# Patient Record
Sex: Female | Born: 2000 | Race: Black or African American | Hispanic: No | Marital: Single | Smoking: Never smoker
Health system: Southern US, Community
[De-identification: ages and names within clinical notes are randomized; demographics above are authoritative.]

---

## 2020-04-18 ENCOUNTER — Other Ambulatory Visit: Payer: Self-pay

## 2020-04-18 ENCOUNTER — Emergency Department (HOSPITAL_COMMUNITY): Payer: 59

## 2020-04-18 ENCOUNTER — Emergency Department (HOSPITAL_COMMUNITY)
Admission: EM | Admit: 2020-04-18 | Discharge: 2020-04-18 | Disposition: A | Discharge status: Home / Self Care | Payer: 59 | Attending: Emergency Medicine | Admitting: Emergency Medicine

## 2020-04-18 ENCOUNTER — Encounter (HOSPITAL_COMMUNITY): Payer: Self-pay | Admitting: Emergency Medicine

## 2020-04-18 DIAGNOSIS — J069 Acute upper respiratory infection, unspecified: Secondary | ICD-10-CM | POA: Insufficient documentation

## 2020-04-18 DIAGNOSIS — Z20822 Contact with and (suspected) exposure to covid-19: Secondary | ICD-10-CM | POA: Insufficient documentation

## 2020-04-18 DIAGNOSIS — R059 Cough, unspecified: Secondary | ICD-10-CM | POA: Diagnosis present

## 2020-04-18 LAB — CBC
HCT: 37.1 % (ref 36.0–46.0)
Hemoglobin: 12.2 g/dL (ref 12.0–15.0)
MCH: 28.9 pg (ref 26.0–34.0)
MCHC: 32.9 g/dL (ref 30.0–36.0)
MCV: 87.9 fL (ref 80.0–100.0)
Platelets: 354 10*3/uL (ref 150–400)
RBC: 4.22 MIL/uL (ref 3.87–5.11)
RDW: 12.8 % (ref 11.5–15.5)
WBC: 16.2 10*3/uL — ABNORMAL HIGH (ref 4.0–10.5)
nRBC: 0 % (ref 0.0–0.2)

## 2020-04-18 LAB — COMPREHENSIVE METABOLIC PANEL
ALT: 14 U/L (ref 0–44)
AST: 16 U/L (ref 15–41)
Albumin: 3.8 g/dL (ref 3.5–5.0)
Alkaline Phosphatase: 54 U/L (ref 38–126)
Anion gap: 9 (ref 5–15)
BUN: 7 mg/dL (ref 6–20)
CO2: 25 mmol/L (ref 22–32)
Calcium: 8.9 mg/dL (ref 8.9–10.3)
Chloride: 102 mmol/L (ref 98–111)
Creatinine, Ser: 0.66 mg/dL (ref 0.44–1.00)
GFR calc Af Amer: >60 mL/min (ref >60)
GFR calc non Af Amer: >60 mL/min (ref >60)
Glucose, Bld: 124 mg/dL — ABNORMAL HIGH (ref 70–99)
Potassium: 3.5 mmol/L (ref 3.5–5.1)
Sodium: 136 mmol/L (ref 135–145)
Total Bilirubin: 0.2 mg/dL — ABNORMAL LOW (ref 0.3–1.2)
Total Protein: 7.6 g/dL (ref 6.5–8.1)

## 2020-04-18 LAB — URINALYSIS, ROUTINE W REFLEX MICROSCOPIC
Bilirubin Urine: NEGATIVE
Glucose, UA: NEGATIVE mg/dL
Ketones, ur: NEGATIVE mg/dL
Nitrite: NEGATIVE
Protein, ur: NEGATIVE mg/dL
Specific Gravity, Urine: 1.021 (ref 1.005–1.030)
pH: 6 (ref 5.0–8.0)

## 2020-04-18 LAB — RESP PANEL BY RT PCR (RSV, FLU A&B, COVID)
Influenza A by PCR: NEGATIVE
Influenza B by PCR: NEGATIVE
Respiratory Syncytial Virus by PCR: NEGATIVE
SARS Coronavirus 2 by RT PCR: NEGATIVE

## 2020-04-18 LAB — I-STAT BETA HCG BLOOD, ED (MC, WL, AP ONLY): I-stat hCG, quantitative: <5 m[IU]/mL (ref <5)

## 2020-04-18 LAB — LIPASE, BLOOD: Lipase: 29 U/L (ref 11–51)

## 2020-04-18 MED ORDER — GUAIFENESIN 100 MG/5ML PO SOLN
5.0000 mL | Freq: Once | ORAL | Status: AC
  Administered 2020-04-18: 100 mg via ORAL
  Filled 2020-04-18: qty 5

## 2020-04-18 MED ORDER — BENZONATATE 100 MG PO CAPS: 100.0000 mg | ORAL_CAPSULE | Freq: Three times a day (TID) | ORAL | 0 refills | Status: AC

## 2020-04-18 MED ORDER — ONDANSETRON 4 MG PO TBDP
4.0000 mg | ORAL_TABLET | Freq: Once | ORAL | Status: AC | PRN
  Administered 2020-04-18: 4 mg via ORAL
  Filled 2020-04-18: qty 1

## 2020-04-18 NOTE — ED Provider Notes (Signed)
[facility]ENCY DEPT [facility] Emergency Department Provider Note MRN:  [id number]  Arrival date & time: 04/18/20     Chief Complaint   Cough   History of Present Illness   Kelsey Cardenas is a 19 y.o. year-old female with no pertinent past medical history presenting to the ED with chief complaint of cough.  1 week of persistent cough, multiple friends with similar cough.  Denies shortness of breath, no chest pain, mild sore throat, no abdominal pain, no fever, no other complaints.  Review of Systems  A problem-focused ROS was performed. Positive for cough.  Patient denies shortness of breath..   Patient's Health History   History reviewed. No pertinent past medical history.  History reviewed. No pertinent surgical history.  No family history on file.  Social History   Socioeconomic History  . Marital status: Single    Spouse name: Not on file  . Number of children: Not on file  . Years of education: Not on file  . Highest education level: Not on file  Occupational History  . Not on file  Tobacco Use  . Smoking status: Never Smoker  . Smokeless tobacco: Never Used  Substance and Sexual Activity  . Alcohol use: Never  . Drug use: Never  . Sexual activity: Not on file  Other Topics Concern  . Not on file  Social History Narrative  . Not on file   Social Determinants of Health   Financial Resource Strain:   . Difficulty of Paying Living Expenses: Not on file  Food Insecurity:   . Worried About Programme researcher, broadcasting/film/video in the Last Year: Not on file  . Ran Out of Food in the Last Year: Not on file  Transportation Needs:   . Lack of Transportation (Medical): Not on file  . Lack of Transportation (Non-Medical): Not on file  Physical Activity:   . Days of Exercise per Week: Not on file  . Minutes of Exercise per Session: Not on file  Stress:   . Feeling of Stress : Not on file  Social Connections:   . Frequency of Communication with Friends and Family: Not on file    . Frequency of Social Gatherings with Friends and Family: Not on file  . Attends Religious Services: Not on file  . Active Member of Clubs or Organizations: Not on file  . Attends Banker Meetings: Not on file  . Marital Status: Not on file  Intimate Partner Violence:   . Fear of Current or Ex-Partner: Not on file  . Emotionally Abused: Not on file  . Physically Abused: Not on file  . Sexually Abused: Not on file     Physical Exam   Vitals:   04/18/20 0658 04/18/20 0913  BP: 136/79 (!) 95/58  Pulse: 77 81  Resp: 18 17  Temp: 98.4 F (36.9 C) 98.4 F (36.9 C)  SpO2: 99% 100%    CONSTITUTIONAL: Well-appearing, NAD NEURO:  Alert and oriented x 3, no focal deficits EYES:  eyes equal and reactive ENT/NECK:  no LAD, no JVD CARDIO: Regular rate, well-perfused, normal S1 and S2 PULM:  CTAB no wheezing or rhonchi GI/GU:  normal bowel sounds, non-distended, non-tender MSK/SPINE:  No gross deformities, no edema SKIN:  no rash, atraumatic PSYCH:  Appropriate speech and behavior  *Additional and/or pertinent findings included in MDM below  Diagnostic and Interventional Summary    EKG Interpretation  Date/Time:    Ventricular Rate:    PR Interval:    QRS  Duration:   QT Interval:    QTC Calculation:   R Axis:     Text Interpretation:        Labs Reviewed  COMPREHENSIVE METABOLIC PANEL - Abnormal; Notable for the following components:      Result Value   Glucose, Bld 124 (*)    Total Bilirubin 0.2 (*)    All other components within normal limits  CBC - Abnormal; Notable for the following components:   WBC 16.2 (*)    All other components within normal limits  URINALYSIS, ROUTINE W REFLEX MICROSCOPIC - Abnormal; Notable for the following components:   Hgb urine dipstick SMALL (*)    Leukocytes,Ua TRACE (*)    Bacteria, UA RARE (*)    All other components within normal limits  RESP PANEL BY RT PCR (RSV, FLU A&B, COVID)  LIPASE, BLOOD  I-STAT BETA HCG  BLOOD, ED ([facility], [facility], [facility] ONLY)    DG Chest Portable 1 View  Final Result      Medications  ondansetron (ZOFRAN-ODT) disintegrating tablet 4 mg (4 mg Oral Given 04/18/20 0336)  guaiFENesin (ROBITUSSIN) 100 MG/5ML solution 100 mg (100 mg Oral Given 04/18/20 0501)     Procedures  /  Critical Care Procedures  ED Course and Medical Decision Making  I have reviewed the triage vital signs, the nursing notes, and pertinent available records from the EMR.  Listed above are laboratory and imaging tests that I personally ordered, reviewed, and interpreted and then considered in my medical decision making (see below for details).  Suspect viral illness, has tested negative for Covid and negative for flu.  No chest pain or shortness of breath, lungs are generally clear on exam, no increased work of breathing, no leg pain or swelling, doubt PE, x-ray without evidence of pneumonia, appropriate for discharge.       Elmer Sow. Pilar Plate, MD [facility] Emergency Medicine [facility] [email or web address]  Final Clinical Impressions(s) / ED Diagnoses     ICD-10-CM   1. Viral URI with cough  J06.9     ED Discharge Orders         Ordered    benzonatate (TESSALON) 100 MG capsule  Every 8 hours        04/18/20 1028           Discharge Instructions Discussed with and Provided to Patient:     Discharge Instructions     You were evaluated in the Emergency Department and after careful evaluation, we did not find any emergent condition requiring admission or further testing in the hospital.  Your exam/testing today was overall reassuring.  Symptoms seem to be due to a viral illness.  You can use the Tessalon medication as needed to suppress your cough.  Please return to the Emergency Department if you experience any worsening of your condition.  Thank you for allowing [place] to be a part of your care.       Sabas Sous, MD 04/18/20 1030

## 2020-04-18 NOTE — ED Triage Notes (Signed)
Pt c/o continuous cough with n/v x 7 days, denies fever. Pt states she has weekly covid tests, neg result. Pt states her roommate and several friends have same cough.  Congested, continuous cough noted. No hx of asthma

## 2020-04-18 NOTE — Discharge Instructions (Addendum)
You were evaluated in the Emergency Department and after careful evaluation, we did not find any emergent condition requiring admission or further testing in the hospital.  Your exam/testing today was overall reassuring.  Symptoms seem to be due to a viral illness.  You can use the Tessalon medication as needed to suppress your cough.  Please return to the Emergency Department if you experience any worsening of your condition.  Thank you for allowing [place] to be a part of your care.

## 2020-04-18 NOTE — ED Notes (Signed)
Pt called for vitals x2 with no response 

## 2020-04-18 NOTE — ED Notes (Signed)
Patient verbalizes understanding of discharge instructions. Opportunity for questioning and answers were provided. Pt discharged from ED. 

## 2021-10-15 IMAGING — DX DG CHEST 1V PORT
1 series · 1 of 1 positions shown · non-contrast
Comparison: None.

CLINICAL DATA: Cough

EXAM:
PORTABLE CHEST 1 VIEW

[chest ap]
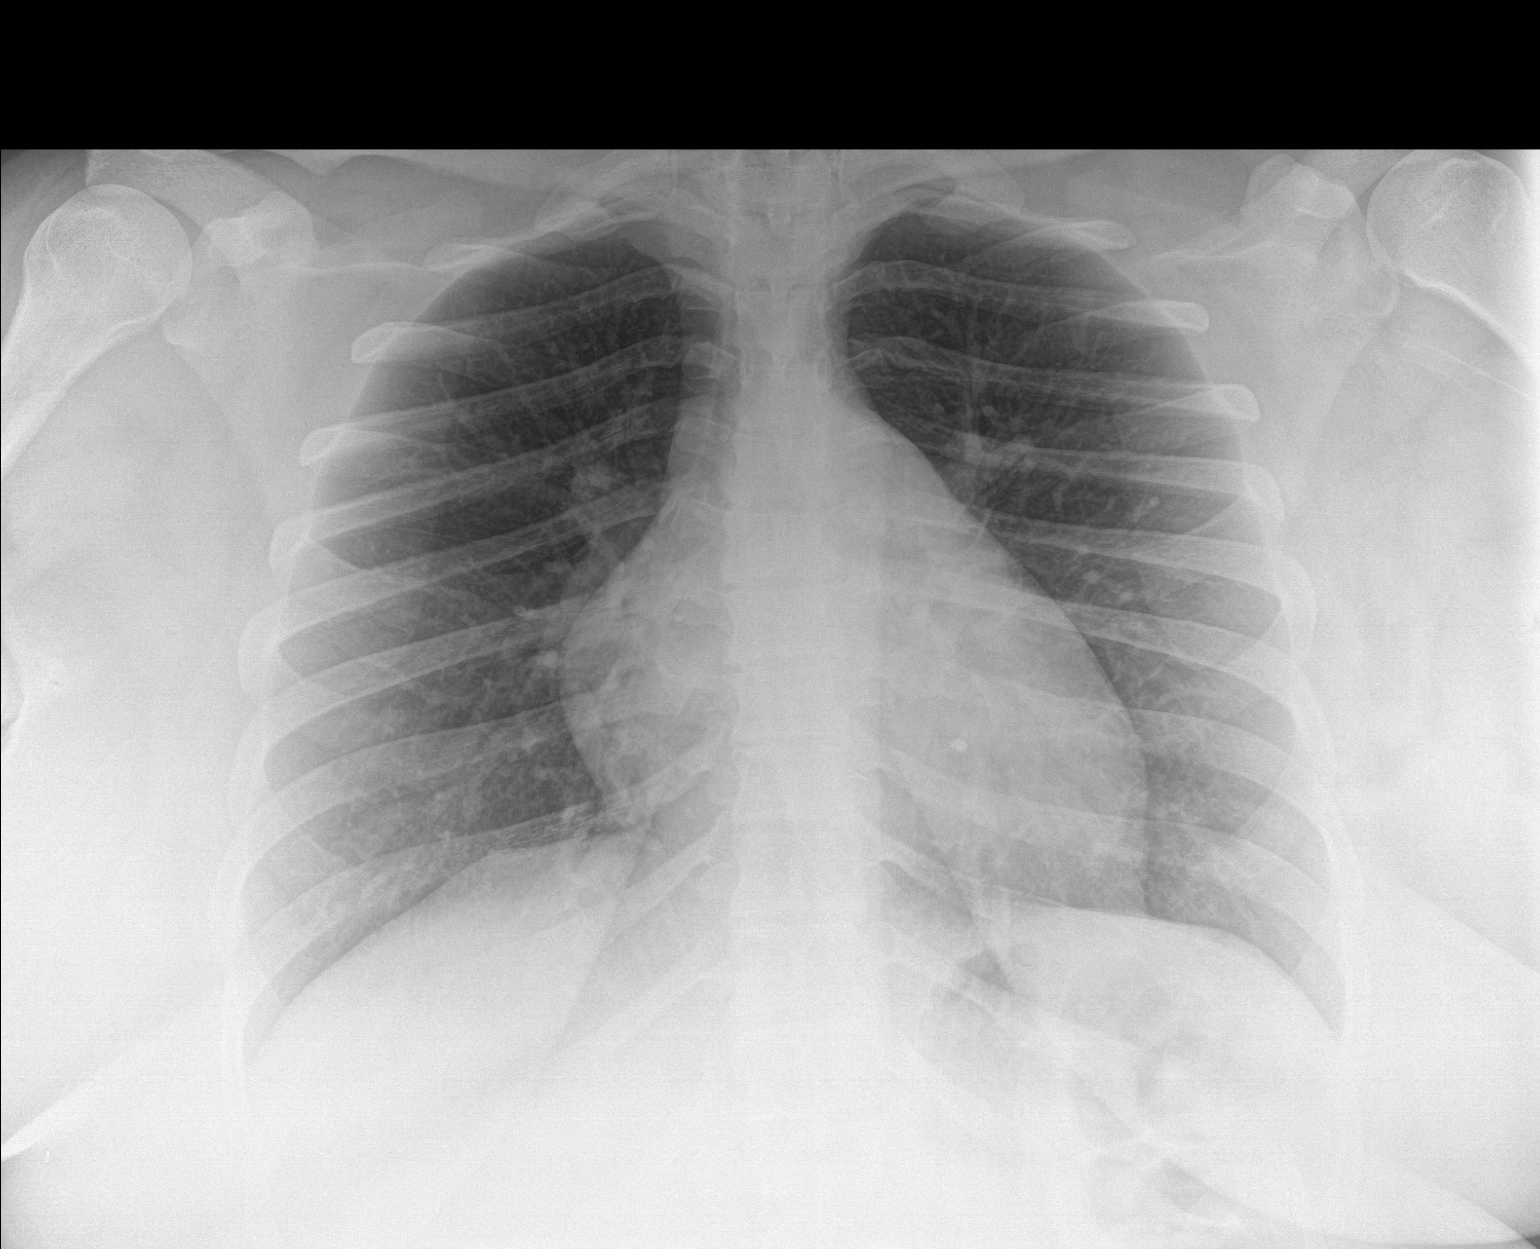

[1 of 1 positions shown; findings below may reference images not displayed]

FINDINGS: The heart size and mediastinal contours are within normal limits.
Both lungs are clear. The visualized skeletal structures are
unremarkable.
IMPRESSION: No active disease.
